# Patient Record
Sex: Male | Born: 1950 | Race: White | Hispanic: No | Marital: Married
Health system: Midwestern US, Academic
[De-identification: ages and names within clinical notes are randomized; demographics above are authoritative.]

---

## 2018-03-02 LAB — LIPID PROFILE
Lab: 116 — ABNORMAL LOW (ref 150–200)
Lab: 66 — ABNORMAL HIGH (ref 0.72–1.25)

## 2018-03-02 LAB — COMPREHENSIVE METABOLIC PANEL
Lab: 14
Lab: 140 — ABNORMAL LOW
Lab: 22
Lab: 37 — ABNORMAL LOW (ref >59)

## 2018-03-02 LAB — THYROID STIMULATING HORMONE-TSH: Lab: 2.4 — ABNORMAL HIGH (ref 8.4–25.7)

## 2018-03-02 LAB — HEMOGLOBIN A1C: Lab: 6.2 — ABNORMAL HIGH (ref 3.5–5.1)

## 2018-03-02 LAB — PROSTATIC SPECIFIC ANTIGEN-PSA: Lab: 3.7 — ABNORMAL LOW (ref 23–31)

## 2018-03-02 LAB — CBC: Lab: 7.6

## 2018-09-07 LAB — BASIC METABOLIC PANEL
Lab: 1.5 — ABNORMAL HIGH (ref 0.72–1.25)
Lab: 108 — ABNORMAL HIGH (ref 98–107)
Lab: 114 — ABNORMAL HIGH (ref 70–105)
Lab: 12
Lab: 139
Lab: 16
Lab: 24
Lab: 48 — ABNORMAL LOW (ref >59)
Lab: 9.2

## 2018-09-07 LAB — HEMOGLOBIN A1C: Lab: 6

## 2018-09-25 ENCOUNTER — Ambulatory Visit: Admit: 2018-09-25 | Discharge: 2018-09-26 | Payer: Commercial Managed Care - PPO

## 2018-09-25 DIAGNOSIS — R0600 Dyspnea, unspecified: Principal | ICD-10-CM

## 2018-09-26 ENCOUNTER — Encounter: Admit: 2018-09-26 | Discharge: 2018-09-26 | Payer: Commercial Managed Care - PPO

## 2018-09-26 DIAGNOSIS — R0600 Dyspnea, unspecified: Principal | ICD-10-CM

## 2018-09-26 DIAGNOSIS — I10 Essential (primary) hypertension: ICD-10-CM

## 2018-10-23 ENCOUNTER — Encounter: Admit: 2018-10-23 | Discharge: 2018-10-23 | Payer: Commercial Managed Care - PPO

## 2018-10-31 ENCOUNTER — Encounter: Admit: 2018-10-31 | Discharge: 2018-10-31 | Payer: Commercial Managed Care - PPO

## 2018-10-31 DIAGNOSIS — E785 Hyperlipidemia, unspecified: Principal | ICD-10-CM

## 2018-10-31 DIAGNOSIS — R0600 Dyspnea, unspecified: ICD-10-CM

## 2018-10-31 DIAGNOSIS — E119 Type 2 diabetes mellitus without complications: ICD-10-CM

## 2018-10-31 DIAGNOSIS — N189 Chronic kidney disease, unspecified: ICD-10-CM

## 2018-11-08 ENCOUNTER — Encounter: Admit: 2018-11-08 | Discharge: 2018-11-08 | Payer: Commercial Managed Care - PPO

## 2018-11-08 ENCOUNTER — Ambulatory Visit: Admit: 2018-11-08 | Discharge: 2018-11-09 | Payer: Commercial Managed Care - PPO

## 2018-11-08 DIAGNOSIS — E119 Type 2 diabetes mellitus without complications: ICD-10-CM

## 2018-11-08 DIAGNOSIS — E785 Hyperlipidemia, unspecified: Principal | ICD-10-CM

## 2018-11-08 DIAGNOSIS — R0600 Dyspnea, unspecified: ICD-10-CM

## 2018-11-08 DIAGNOSIS — N189 Chronic kidney disease, unspecified: ICD-10-CM

## 2018-11-08 DIAGNOSIS — I10 Essential (primary) hypertension: Principal | ICD-10-CM

## 2018-11-09 ENCOUNTER — Encounter: Admit: 2018-11-09 | Discharge: 2018-11-09 | Payer: Commercial Managed Care - PPO

## 2018-11-09 NOTE — Telephone Encounter
-----   Message from Weston Brass sent at 11/08/2018  5:31 PM CDT -----  Regarding: CCTA with [facility]  Dr Geronimo Boot would like CCTA with [facility] Dx dyspnea.    Let me know if you need anything else.

## 2018-11-27 ENCOUNTER — Encounter: Admit: 2018-11-27 | Discharge: 2018-11-27 | Payer: Commercial Managed Care - PPO

## 2018-11-27 DIAGNOSIS — R0602 Shortness of breath: Principal | ICD-10-CM

## 2018-11-27 DIAGNOSIS — R0600 Dyspnea, unspecified: Principal | ICD-10-CM

## 2018-11-27 DIAGNOSIS — R9430 Abnormal result of cardiovascular function study, unspecified: ICD-10-CM

## 2018-11-27 MED ORDER — NEBIVOLOL 10 MG PO TAB
10 mg | ORAL_TABLET | Freq: Every day | ORAL | 0 refills | 60.00000 days | Status: AC
Start: 2018-11-27 — End: ?

## 2018-11-27 MED ORDER — PREDNISONE 20 MG PO TAB
ORAL_TABLET | 0 refills | Status: AC
Start: 2018-11-27 — End: ?

## 2018-11-27 NOTE — Patient Instructions
Coronary CT Angiography Instructions    Stephen Gilmore  [id number]  [birth year]  11/27/2018    ARRIVAL TIME    Please report to the Cardiovascular Medicine Clinic at the [facility] on: 12/03/18  Please arrive at the following time: 1100            DO NOT EAT FOR 4 HOURS PRIOR TO YOUR PROCEDURE.  YOU SHOULD DRINK PLENTY OF CLEAR LIQUIDS UP TO ARRIVAL AT OFFICE.    Pre-Procedure Heart Rate Medication Instructions: Bystolic 10mg  the night before the test at 8pm and the morning of the test at 8am It is important to take these medications exactly as written.  These medications prepare you for the procedure.    Do Not Take Metformin The Day Of The Procedure And Do Not Take For 24 Hours After Procedure: Verified       Do Not Take Regular Insulin The Morning Of The Procedure: Verified    Take 1/3 Dose Of NPH Insulin The Morning Of The Procedure: Verified      Do not take any non-steroidal inflammatory medications, (ibuprofen, Aleve, Advil, etc.) for 48 hours beginning the day of the procedure.    Hold All Diuretics For 24 Hours Beginning The Day Of The Procedure: Verified    You May Take All Other Medications With Water The Morning Of The Procedure: Verified       No Viagra, Cialis, or Levitra Within 48 Hours Of Procedure. Nitroglycerin May Be Used During The Procedure: Verified    ALLERGIES    Allergies   Allergen Reactions   ??? Iodinated Contrast Media RASH   ??? Penicillins      Allergy recorded in SMS: Penicillin~Reactions: HIVES       SPECIAL ALLERGY INSTRUCTIONS    Take Prednisone 60mg  The Night Before And The Morning Of The Procedure: Yes    CURRENT MEDICATIONS  Outpatient Encounter Medications as of 11/27/2018   Medication Sig Dispense Refill   ??? aspirin EC 81 mg tablet Take 81 mg by mouth daily. Take with food.     ??? atorvastatin (LIPITOR) 20 mg tablet Take 20 mg by mouth daily.     ??? finasteride (PROSCAR) 5 mg tablet Take 5 mg by mouth daily.

## 2018-12-03 ENCOUNTER — Encounter: Admit: 2018-12-03 | Discharge: 2018-12-03 | Payer: Commercial Managed Care - PPO

## 2018-12-03 ENCOUNTER — Ambulatory Visit: Admit: 2018-12-03 | Discharge: 2018-12-03 | Payer: Commercial Managed Care - PPO

## 2018-12-03 ENCOUNTER — Ambulatory Visit: Admit: 2018-12-03 | Discharge: 2018-12-04 | Payer: Commercial Managed Care - PPO

## 2018-12-03 DIAGNOSIS — R0602 Shortness of breath: Principal | ICD-10-CM

## 2018-12-03 DIAGNOSIS — R0609 Other forms of dyspnea: ICD-10-CM

## 2018-12-03 DIAGNOSIS — R9430 Abnormal result of cardiovascular function study, unspecified: ICD-10-CM

## 2018-12-03 LAB — POC CREATININE, RAD: Lab: 1.5 mg/dL — ABNORMAL HIGH (ref 0.4–1.24)

## 2018-12-03 MED ORDER — NITROGLYCERIN 400 MCG/SPRAY TL SPRY
1-2 | 0 refills | Status: DC | PRN
Start: 2018-12-03 — End: 2018-12-08

## 2018-12-03 MED ORDER — DIPHENHYDRAMINE HCL 50 MG PO CAP
50 mg | Freq: Once | ORAL | 0 refills | Status: DC | PRN
Start: 2018-12-03 — End: 2018-12-08

## 2018-12-03 MED ORDER — SODIUM CHLORIDE 0.9 % IJ SOLN
100 mL | Freq: Once | INTRAVENOUS | 0 refills | Status: CP
Start: 2018-12-03 — End: ?
  Administered 2018-12-03: 17:00:00 100 mL via INTRAVENOUS

## 2018-12-03 MED ORDER — SODIUM CHLORIDE 0.9 % IV SOLP
250 mL | INTRAVENOUS | 0 refills | Status: DC
Start: 2018-12-03 — End: 2018-12-08

## 2018-12-03 MED ORDER — METOPROLOL TARTRATE 5 MG/5 ML IV SOLN
5 mg | INTRAVENOUS | 0 refills | Status: DC | PRN
Start: 2018-12-03 — End: 2018-12-08

## 2018-12-03 MED ORDER — METHYLPREDNISOLONE SOD SUC(PF) 125 MG/2 ML IJ SOLR
125 mg | Freq: Once | INTRAVENOUS | 0 refills | Status: DC | PRN
Start: 2018-12-03 — End: 2018-12-08

## 2018-12-03 MED ORDER — IVABRADINE 7.5 MG PO TAB
15 mg | Freq: Once | ORAL | 0 refills | Status: DC | PRN
Start: 2018-12-03 — End: 2018-12-08

## 2018-12-03 MED ORDER — DIPHENHYDRAMINE HCL 50 MG/ML IJ SOLN
50 mg | Freq: Once | INTRAVENOUS | 0 refills | Status: DC | PRN
Start: 2018-12-03 — End: 2018-12-08

## 2018-12-03 MED ORDER — SODIUM CHLORIDE 0.9 % IV SOLP
250 mL | INTRAVENOUS | 0 refills | Status: DC | PRN
Start: 2018-12-03 — End: 2018-12-08

## 2018-12-03 MED ORDER — IOPAMIDOL 76 % IV SOLN
90 mL | Freq: Once | INTRAVENOUS | 0 refills | Status: CP
Start: 2018-12-03 — End: ?
  Administered 2018-12-03: 17:00:00 90 mL via INTRAVENOUS

## 2018-12-21 ENCOUNTER — Encounter: Admit: 2018-12-21 | Discharge: 2018-12-21 | Payer: Commercial Managed Care - PPO

## 2019-01-22 ENCOUNTER — Encounter: Admit: 2019-01-22 | Discharge: 2019-01-22

## 2019-01-22 ENCOUNTER — Ambulatory Visit: Admit: 2019-01-22 | Discharge: 2019-01-23

## 2019-01-22 DIAGNOSIS — N189 Chronic kidney disease, unspecified: Secondary | ICD-10-CM

## 2019-01-22 DIAGNOSIS — E119 Type 2 diabetes mellitus without complications: Secondary | ICD-10-CM

## 2019-01-22 DIAGNOSIS — R0602 Shortness of breath: Secondary | ICD-10-CM

## 2019-01-22 DIAGNOSIS — R0600 Dyspnea, unspecified: Secondary | ICD-10-CM

## 2019-01-22 DIAGNOSIS — E785 Hyperlipidemia, unspecified: Secondary | ICD-10-CM

## 2019-01-22 NOTE — Progress Notes
TLR ordered PFT to be done at [facility].  Order faxed to [facility] Scheduling.  No PA required. Ref # [id number]. Scheduling will call pt directly to schedule.

## 2019-01-22 NOTE — Progress Notes
Date of Service: 01/22/2019    Stephen Gilmore is a 68 y.o. male.       HPI       I saw Mr. Dugar today to review his CT findings with him.  He does have exertional dyspnea.  It is inconsistent, not accelerating.  With diabetes mellitus and dyspnea on exertion, I am concerned about whether or not we should repeat a heart cath.  He had one about 14 years ago for a similar indication, but they found no significant obstructive disease.     Together, we decided that we would hold off.  He is to continue with his risk factor modification.  If his symptoms increase in frequency, we will consider a heart cath at that time.  I think he is more comfortable with this approach rather than heart cath, and we will turn our attention to doing pulmonary function tests as he wonders if he might have some reactive airway disease in light of his ubiquitous allergies.  I think it is a reasonable alternative, and we are going to set that up with his complaints of dyspnea and allergy as soon as feasible.    ([id number])           Vitals:    01/22/19 0813 01/22/19 0820   BP: 128/80 132/80   BP Source: Arm, Left Upper Arm, Right Upper   Pulse: 88    SpO2: 98%    Weight: 100.2 kg (220 lb 12.8 oz)    Height: 1.803 m (5' 11)    PainSc: Zero      Body mass index is 30.8 kg/m???.     Past Medical History  Patient Active Problem List    Diagnosis Date Noted   ??? Hypertension 10/31/2018   ??? Dyslipidemia 10/31/2018     09/25/2018 MPI:  This study is probably normal with no evidence of significant myocardial ischemia.  There is evidence of probable small area of myocardial attenuation from increased radiotracer uptake within the small bowel.  There are no definitive perfusion abnormalities.  There is normal myocardial thickening and wall motion in all segments.  While the calculated left ventricular systolic function was mildly reduced, qualitatively the LV systolic function appears to be normal, EF 55%. There is no high risk prognostic indicators present.  The pharmacologic ECG portion of the study is negative for ischemia.        ??? CKD (chronic kidney disease) 10/31/2018   ??? Diabetes mellitus (HCC) 10/31/2018   ??? Dyspnea on exertion 10/31/2018         Review of Systems   Constitution: Negative.   HENT: Negative.    Eyes: Negative.    Cardiovascular: Positive for dyspnea on exertion.   Respiratory: Positive for cough and shortness of breath.    Endocrine: Negative.    Hematologic/Lymphatic: Negative.    Skin: Negative.    Musculoskeletal: Negative.    Gastrointestinal: Negative.    Genitourinary: Negative.    Neurological: Negative.    Psychiatric/Behavioral: Negative.    Allergic/Immunologic: Negative.        Physical Exam    Examination is limited.  He is wearing a mask.  He is ambulatory.  No weakness.  Speech is good.  Affect is normal.  Gait is normal.  Muscle strength is normal.  No edema.    ([id number])      Cardiovascular Studies      Problems Addressed Today  No diagnosis found.    Assessment and Plan  In summary, this is a 68 year old gentleman we are following for coronary disease.  His CT has some residual uncertainty left.  His dyspnea is problematical.  I am going to check a pulmonary function test, have him back in September.    ([id number])             Current Medications (including today's revisions)  ??? aspirin EC 81 mg tablet Take 81 mg by mouth daily. Take with food.   ??? atorvastatin (LIPITOR) 20 mg tablet Take 20 mg by mouth daily.   ??? finasteride (PROSCAR) 5 mg tablet Take 5 mg by mouth daily.   ??? insulin glargine U-300 conc (TOUJEO SOLOSTAR) 300 unit/mL (1.5 mL) injectable Inject 30 Units under the skin daily.   ??? insulin lispro (HUMALOG KWIKPEN INSULIN) 100 unit/mL injection PEN Inject 10 Units under the skin twice daily.   ??? levocetirizine 5 mg tab Take 1 tablet by mouth daily.   ??? lisinopriL (ZESTRIL) 5 mg tablet Take 5 mg by mouth daily. ??? mometasone (NASONEX) 50 mcg/actuation nasal spray Apply 2 sprays to each nostril as directed as Needed.   ??? montelukast (SINGULAIR) 10 mg tablet Take 10 mg by mouth at bedtime daily.   ??? pantoprazole DR (PROTONIX) 40 mg tablet Take 40 mg by mouth daily.   ??? predniSONE (DELTASONE) 20 mg tablet Take 3 tablets the night before the test at 8pm and 3 tablets the morning of the test at 8am   ??? tamsulosin (FLOMAX) 0.4 mg capsule Take 0.4 mg by mouth daily. Do not crush, chew or open capsules. Take 30 minutes following the same meal each day.

## 2019-01-22 NOTE — Patient Instructions
Scheduling will call you to schedule Pulmonary function testing after we receive prior authorization from your insurance.

## 2019-03-06 ENCOUNTER — Encounter: Admit: 2019-03-06 | Discharge: 2019-03-06

## 2019-03-06 DIAGNOSIS — R911 Solitary pulmonary nodule: Secondary | ICD-10-CM

## 2019-03-06 DIAGNOSIS — R9389 Abnormal findings on diagnostic imaging of other specified body structures: Secondary | ICD-10-CM

## 2019-03-06 NOTE — Telephone Encounter
03/06/2019 5:20 PM Dr. Aris Georgia reviewed 01/29/2019 PFT results, completed at [facility].  They are normal.    Copy of results sent to [facility] for scanning into EMR.    Called patient and relayed results of test.

## 2019-03-07 NOTE — Telephone Encounter
03/07/2019 9:14 AM [facility] and scheduled PET scan for 03/26/19 at 7 AM (6:30 AM check-in), at [facility], [place].  Patient should be NPO and hold insulin 6 hours prior to the scan.  Enter though the second floor (main floor) for COVID Screening; then, go down to 1st floor for the Radiology Dept.  Park in patient parking garage or use valet parking.    Called patient and relayed all of the above.  Provided [facility] number, should patient have questions or need to reschedule.

## 2019-03-15 ENCOUNTER — Encounter: Admit: 2019-03-15 | Discharge: 2019-03-15

## 2019-03-15 NOTE — Telephone Encounter
Patients wife LM asking if his PET scan had been approved. He is scheduled to have it done on Monday 7/27    Contacted Madlyn Frankel with the radiology PA department - She states the PET scan is approved.     Notified patient of approval.

## 2019-03-18 ENCOUNTER — Encounter: Admit: 2019-03-18 | Discharge: 2019-03-18

## 2019-03-18 DIAGNOSIS — R911 Solitary pulmonary nodule: Secondary | ICD-10-CM

## 2019-03-18 DIAGNOSIS — R918 Other nonspecific abnormal finding of lung field: Secondary | ICD-10-CM

## 2019-03-18 DIAGNOSIS — R9389 Abnormal findings on diagnostic imaging of other specified body structures: Principal | ICD-10-CM

## 2019-03-18 LAB — POC GLUCOSE: Lab: 119 mg/dL — ABNORMAL HIGH (ref 70–100)

## 2019-03-18 MED ORDER — RP DX F-18 FDG MCI
15 | Freq: Once | INTRAVENOUS | 0 refills | Status: CP
Start: 2019-03-18 — End: ?
  Administered 2019-03-18: 12:00:00 16.8 via INTRAVENOUS

## 2019-04-24 ENCOUNTER — Encounter: Admit: 2019-04-24 | Discharge: 2019-04-24

## 2019-04-30 ENCOUNTER — Encounter: Admit: 2019-04-30 | Discharge: 2019-04-30

## 2019-04-30 DIAGNOSIS — E785 Hyperlipidemia, unspecified: Secondary | ICD-10-CM

## 2019-04-30 DIAGNOSIS — N189 Chronic kidney disease, unspecified: Secondary | ICD-10-CM

## 2019-04-30 DIAGNOSIS — R0600 Dyspnea, unspecified: Secondary | ICD-10-CM

## 2019-04-30 DIAGNOSIS — E119 Type 2 diabetes mellitus without complications: Secondary | ICD-10-CM

## 2019-10-02 ENCOUNTER — Encounter: Admit: 2019-10-02 | Discharge: 2019-10-02 | Payer: Commercial Managed Care - PPO

## 2019-10-02 NOTE — Telephone Encounter
LVM with radiology scheduling number [phone] to reschedule NM PET scan. Pt was also scheduled to see Dr. Larwance Sachs in clinic on 07/30/2019 but cancelled appointment.

## 2020-01-30 ENCOUNTER — Encounter: Admit: 2020-01-30 | Discharge: 2020-01-30 | Payer: Commercial Managed Care - PPO

## 2023-01-25 IMAGING — CT CHEST WO(Adult)
2 of 6 series · 15 of 36 positions shown, 18 images · non-contrast
Comparison: none

[Series 4: thorax cor 1.50 br40 s3 · coronal · 0.70mm/px · 3 of 215 slices shown]
[im 43/215  lung]
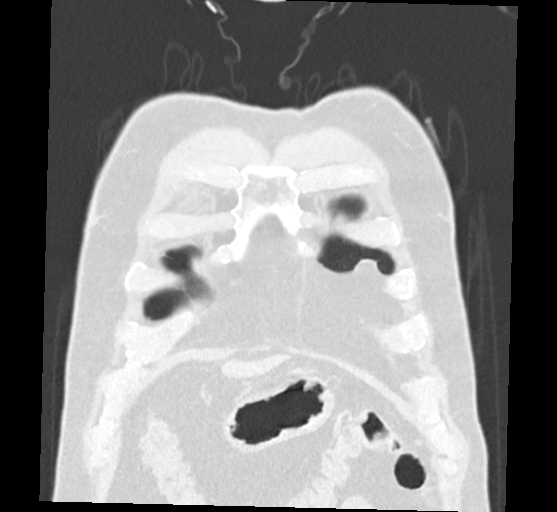
[im 86/215  lung]
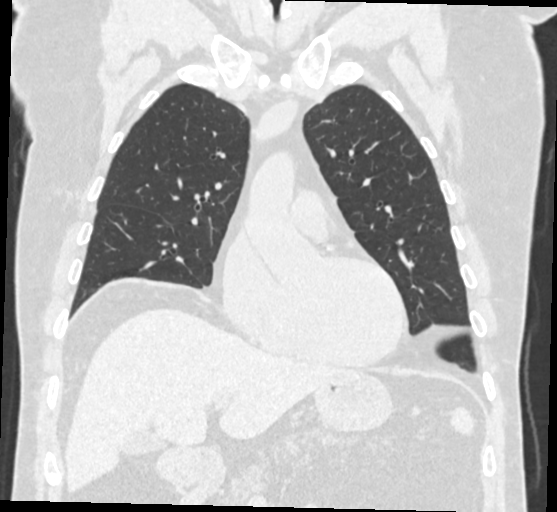
[im 129/215  lung]
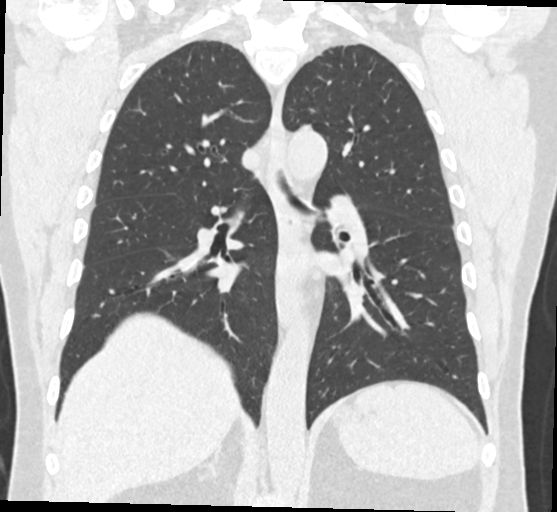

[Series 11: thorax 1.00 br60 s3 · axial · 0.76mm/px · z∈[+1657,+1953]mm · 12 of 500 slices shown, 15 images]
[im 39/500  mediastinal]
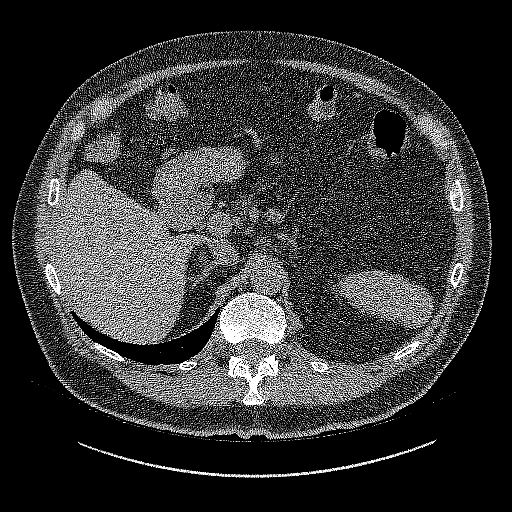
[im 39/500  lung]
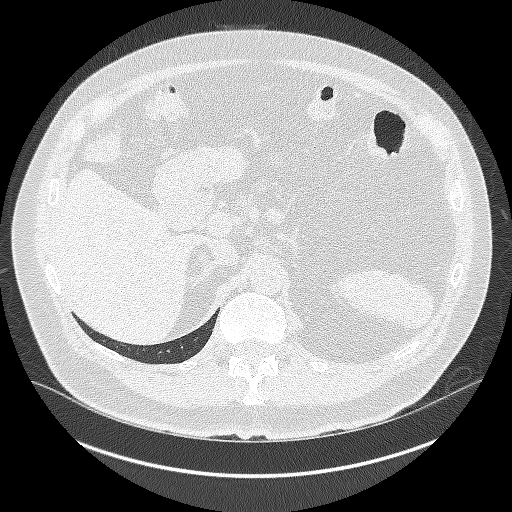
[im 77/500  lung]
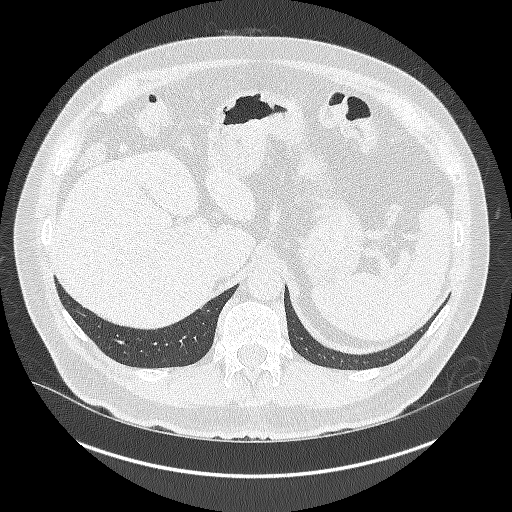
[im 116/500  lung]
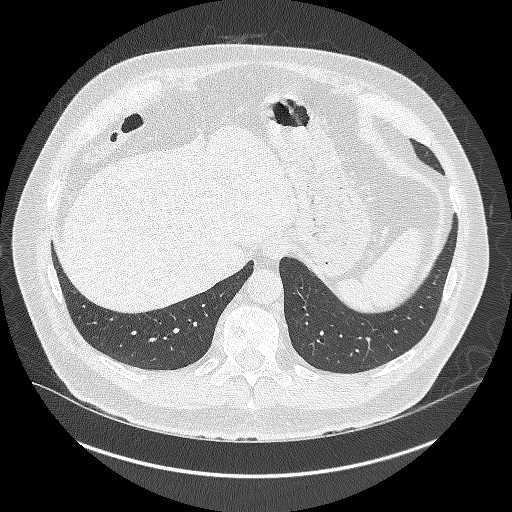
[im 154/500  lung]
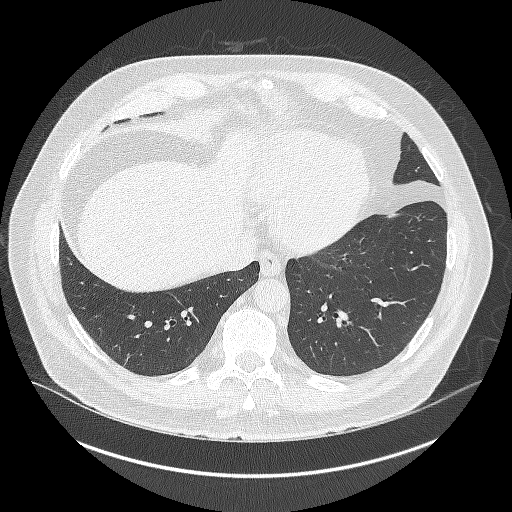
[im 192/500  mediastinal]
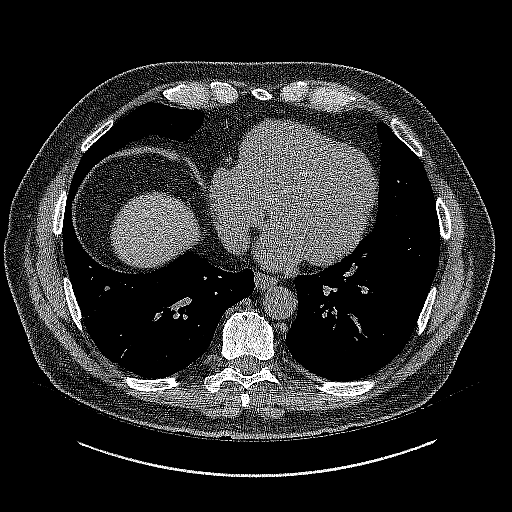
[im 192/500  lung]
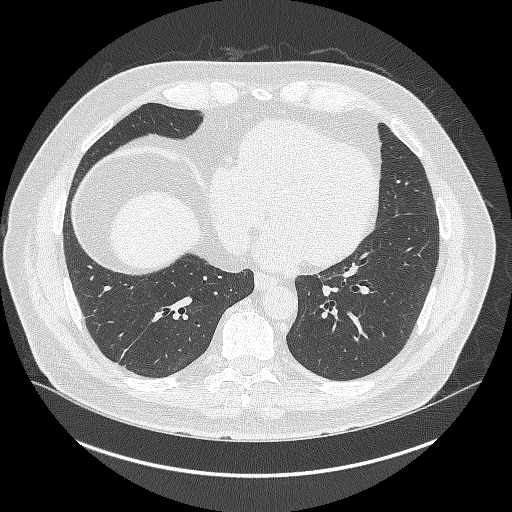
[im 231/500  lung]
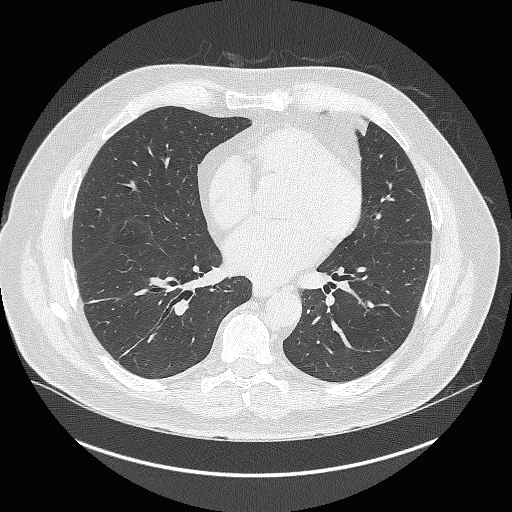
[im 269/500  lung]
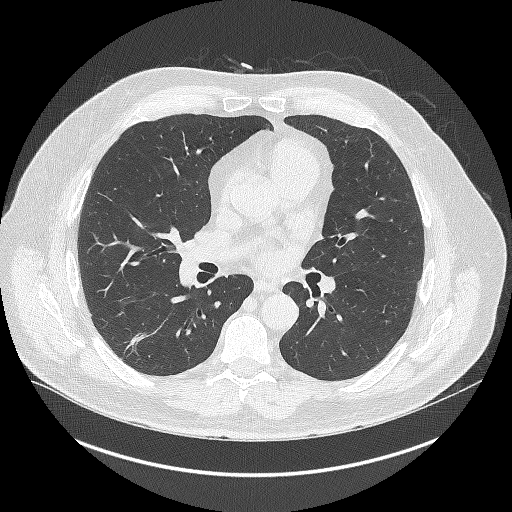
[im 308/500  lung]
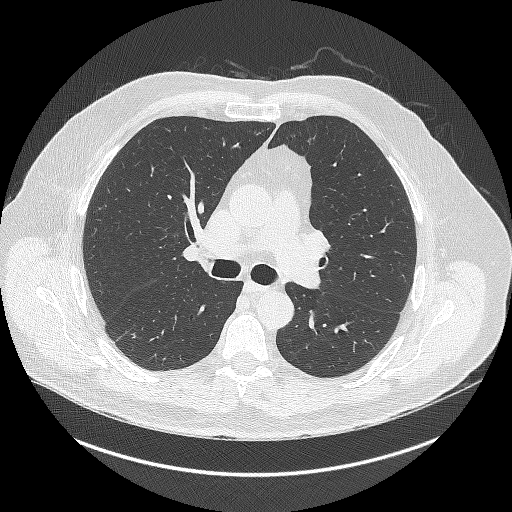
[im 346/500  mediastinal]
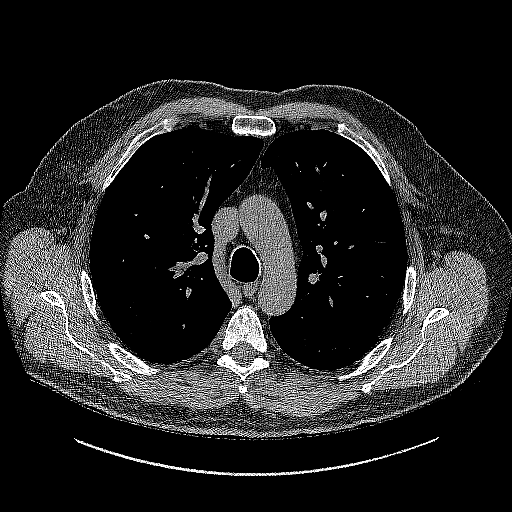
[im 346/500  lung]
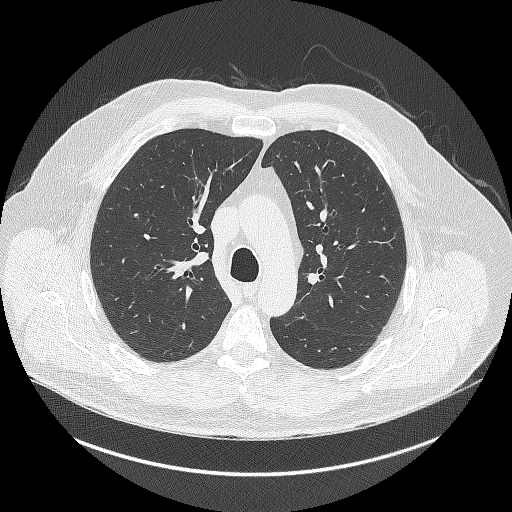
[im 384/500  lung]
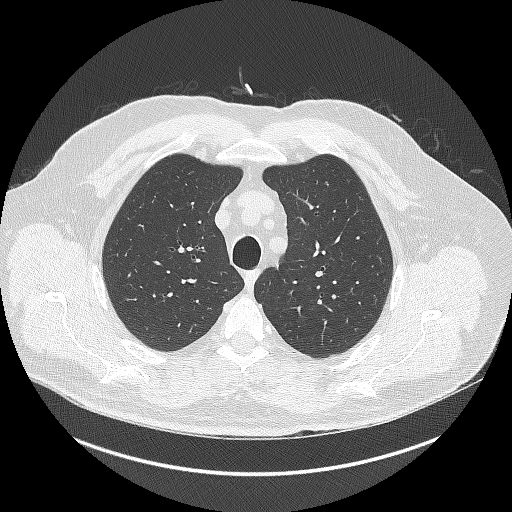
[im 423/500  lung]
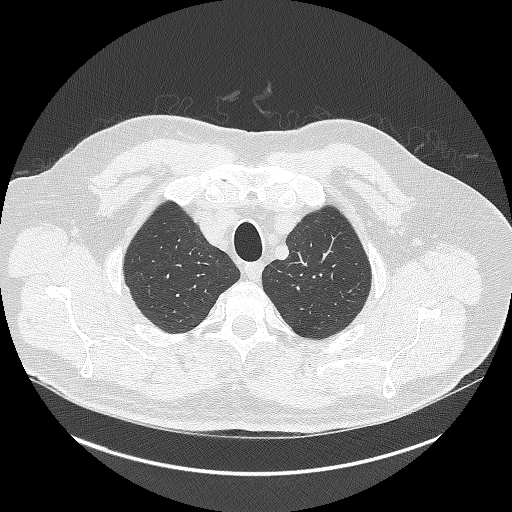
[im 461/500  lung]
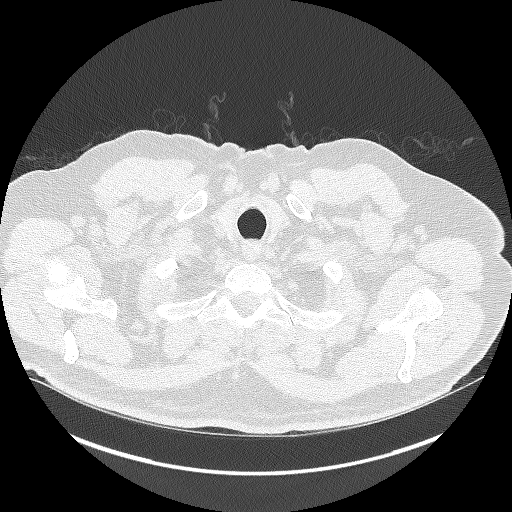

[15 of 36 positions shown; findings below may reference images not displayed]

DIAGNOSTIC STUDIES

EXAM

CT chest wo con

INDICATION

Pulmonary nodules follow up
PT STATES F/U PULMONARY NODULES. CT/NM 1/0. TJ

TECHNIQUE

All CT scans at this facility use dose modulation, iterative reconstruction, and/or weight based
dosing when appropriate to reduce radiation dose to as low as reasonably achievable.

Number of previous computed tomography exams in the last 12 months is 1.

Number of previous nuclear medicine myocardial perfusion studies in the last 12 months is 0  .

COMPARISONS

October 29, 2020

FINDINGS

Heart size is normal. Coronary artery calcifications. Thoracic aorta is normal in caliber. There
are no enlarged mediastinal nodes by size criteria.

Right lower lobe 9 x 7 millimeter nodule on series 2, image 134 appears calcified on today's
examination. Stable from prior when measured in a similar fashion.
Medial right lower lobe nodule measuring 8.5 x 6 millimeters on series 2, image 111, stable from
May 08, 2020 when measured in a similar fashion.

Additional sub 5 millimeter nodules are re-demonstrated. No focal pneumonia, pleural effusion, or
pneumothorax.

There are no acute findings in the upper abdomen. Atrophic changes of the pancreas.

IMPRESSION

Largest nodule is better demonstrated as calcified on today's examination. Additional enlarged
medial right lower lobe nodule is stable from May 08, 2020. Continued follow-up is
recommended.

Tech Notes:

PT STATES F/U PULMONARY NODULES. CT/NM 1/0. TJ
